# Patient Record
Sex: Female | Born: 1970
Health system: Southern US, Community
[De-identification: ages and names within clinical notes are randomized; demographics above are authoritative.]

## PROBLEM LIST (undated history)

## (undated) HISTORY — PX: ABDOMINAL HYSTERECTOMY: SHX81

---

## 2016-10-26 ENCOUNTER — Encounter (HOSPITAL_BASED_OUTPATIENT_CLINIC_OR_DEPARTMENT_OTHER): Payer: Self-pay | Admitting: Emergency Medicine

## 2016-10-26 ENCOUNTER — Emergency Department (HOSPITAL_BASED_OUTPATIENT_CLINIC_OR_DEPARTMENT_OTHER): Payer: BLUE CROSS/BLUE SHIELD

## 2016-10-26 ENCOUNTER — Emergency Department (HOSPITAL_BASED_OUTPATIENT_CLINIC_OR_DEPARTMENT_OTHER)
Admission: EM | Admit: 2016-10-26 | Discharge: 2016-10-26 | Disposition: A | Discharge status: Home / Self Care | Payer: BLUE CROSS/BLUE SHIELD | Attending: Emergency Medicine | Admitting: Emergency Medicine

## 2016-10-26 DIAGNOSIS — F17200 Nicotine dependence, unspecified, uncomplicated: Secondary | ICD-10-CM | POA: Insufficient documentation

## 2016-10-26 DIAGNOSIS — R1032 Left lower quadrant pain: Secondary | ICD-10-CM | POA: Insufficient documentation

## 2016-10-26 DIAGNOSIS — Z79899 Other long term (current) drug therapy: Secondary | ICD-10-CM | POA: Diagnosis not present

## 2016-10-26 DIAGNOSIS — R109 Unspecified abdominal pain: Secondary | ICD-10-CM

## 2016-10-26 LAB — URINALYSIS, ROUTINE W REFLEX MICROSCOPIC
BILIRUBIN URINE: NEGATIVE
Glucose, UA: NEGATIVE mg/dL
Hgb urine dipstick: NEGATIVE
Ketones, ur: NEGATIVE mg/dL
NITRITE: NEGATIVE
PH: 7 (ref 5.0–8.0)
Protein, ur: NEGATIVE mg/dL
SPECIFIC GRAVITY, URINE: 1.008 (ref 1.005–1.030)

## 2016-10-26 LAB — URINALYSIS, MICROSCOPIC (REFLEX): RBC / HPF: NONE SEEN RBC/hpf (ref 0–5)

## 2016-10-26 MED ORDER — ONDANSETRON 8 MG PO TBDP
8.0000 mg | ORAL_TABLET | Freq: Once | ORAL | Status: AC
  Administered 2016-10-26: 8 mg via ORAL
  Filled 2016-10-26: qty 1

## 2016-10-26 MED ORDER — TRAMADOL HCL 50 MG PO TABS: 50.0000 mg | ORAL_TABLET | Freq: Four times a day (QID) | ORAL | 0 refills | Status: AC | PRN

## 2016-10-26 MED ORDER — HYDROCODONE-ACETAMINOPHEN 5-325 MG PO TABS
2.0000 | ORAL_TABLET | Freq: Once | ORAL | Status: AC
  Administered 2016-10-26: 2 via ORAL
  Filled 2016-10-26: qty 2

## 2016-10-26 NOTE — ED Triage Notes (Signed)
L flank pain, dysuria, hematuria x 4 days. Sent from [facility].

## 2016-10-26 NOTE — ED Provider Notes (Addendum)
[facility]   CSN: [id number] Arrival date & time: 10/26/16  1056     History   Chief Complaint Chief Complaint  Patient presents with  . Flank Pain    HPI Danielle Douglas is a 46 y.o. female.  Patient c/o left flank pain, acute onset x 4 days. Pain constant, but waxes/wanes in severity, dull.  Occasionally radiates towards left leg.  No leg numbness/weakness. . No hx same pain. Denies injury or strain to area. No vomiting or diarrhea. No constipation. Denies hematuria. +blood in urine at outside urgent care. No vaginal bleeding or rectal bleeding. No hx kidney stones. No hx diverticula. Normal appetite. Denies cough or uri c/o. No cp or sob.    The history is provided by the patient.  Flank Pain  Associated symptoms include abdominal pain. Pertinent negatives include no chest pain, no headaches and no shortness of breath.    History reviewed. No pertinent past medical history.  There are no active problems to display for this patient.   Past Surgical History:  Procedure Laterality Date  . ABDOMINAL HYSTERECTOMY      OB History    No data available       Home Medications    Prior to Admission medications   Medication Sig Start Date End Date Taking? Authorizing Provider  pantoprazole (PROTONIX) 40 MG tablet Take 40 mg by mouth daily.   Yes [provider]    Family History No family history on file.  Social History Social History  Substance Use Topics  . Smoking status: Current Every Day Smoker  . Smokeless tobacco: Never Used  . Alcohol use Yes     Allergies   Percocet [oxycodone-acetaminophen]   Review of Systems Review of Systems  Constitutional: Negative for fever.  HENT: Negative for sore throat.   Eyes: Negative for redness.  Respiratory: Negative for shortness of breath.   Cardiovascular: Negative for chest pain.  Gastrointestinal: Positive for abdominal pain. Negative for vomiting.  Genitourinary:  Positive for flank pain. Negative for dysuria, hematuria, vaginal bleeding and vaginal discharge.  Musculoskeletal: Negative for back pain and neck pain.  Skin: Negative for rash.  Neurological: Negative for headaches.  Hematological: Does not bruise/bleed easily.  Psychiatric/Behavioral: Negative for confusion.     Physical Exam Updated Vital Signs BP 121/78 (BP Location: Right Arm)   Pulse (!) 58   Temp 98.9 F (37.2 C) (Oral)   Resp 18   Ht 1.676 m (5\' 6" )   Wt 81.6 kg (180 lb)   SpO2 98%   BMI 29.05 kg/m   Physical Exam  Constitutional: She appears well-developed and well-nourished. No distress.  HENT:  Mouth/Throat: Oropharynx is clear and moist.  Eyes: Conjunctivae are normal. No scleral icterus.  Neck: Neck supple. No tracheal deviation present.  Cardiovascular: Normal rate, regular rhythm, normal heart sounds and intact distal pulses.  Exam reveals no gallop and no friction rub.   No murmur heard. Pulmonary/Chest: Effort normal and breath sounds normal. No respiratory distress.  Abdominal: Soft. Normal appearance and bowel sounds are normal. She exhibits no distension and no mass. There is no tenderness. There is no rebound and no guarding. No hernia.  No incarcerated hernia.   Genitourinary:  Genitourinary Comments: No cva tenderness  Musculoskeletal: She exhibits no edema.  TLS spine non tender, aligned. Good rom left hip without pain.   Neurological: She is alert.  Ambulates w steady gait.   Skin: Skin is warm and dry. No rash  noted. She is not diaphoretic.  No shingles/rash to area of pain  Psychiatric: She has a normal mood and affect.  Nursing note and vitals reviewed.    ED Treatments / Results  Labs (all labs ordered are listed, but only abnormal results are displayed)  Results for orders placed or performed during the hospital encounter of 10/26/16  Urinalysis, Routine w reflex microscopic- may I&O cath if menses  Result Value Ref Range   Color,  Urine YELLOW YELLOW   APPearance CLEAR CLEAR   Specific Gravity, Urine 1.008 1.005 - 1.030   pH 7.0 5.0 - 8.0   Glucose, UA NEGATIVE NEGATIVE mg/dL   Hgb urine dipstick NEGATIVE NEGATIVE   Bilirubin Urine NEGATIVE NEGATIVE   Ketones, ur NEGATIVE NEGATIVE mg/dL   Protein, ur NEGATIVE NEGATIVE mg/dL   Nitrite NEGATIVE NEGATIVE   Leukocytes, UA TRACE (A) NEGATIVE  Urinalysis, Microscopic (reflex)  Result Value Ref Range   RBC / HPF NONE SEEN 0 - 5 RBC/hpf   WBC, UA 0-5 0 - 5 WBC/hpf   Bacteria, UA FEW (A) NONE SEEN   Squamous Epithelial / LPF 0-5 (A) NONE SEEN     EKG  EKG Interpretation None       Radiology Ct Renal Stone Study  Result Date: 10/26/2016 CLINICAL DATA:  Flank pain and hematuria. EXAM: CT ABDOMEN AND PELVIS WITHOUT CONTRAST TECHNIQUE: Multidetector CT imaging of the abdomen and pelvis was performed following the standard protocol without IV contrast. COMPARISON:  None FINDINGS: Lower chest: The lung bases are clear. No pleural or pericardial effusion. Hepatobiliary: No focal liver abnormality is seen. No gallstones, gallbladder wall thickening, or biliary dilatation. Pancreas: Unremarkable. No pancreatic ductal dilatation or surrounding inflammatory changes. Spleen: Normal in size without focal abnormality. Adrenals/Urinary Tract: The adrenal glands are normal. No kidney mass or hydronephrosis identified. No kidney stones. The urinary bladder is unremarkable. Stomach/Bowel: The stomach and the small bowel loops have a normal course and caliber. No bowel obstruction. The appendix is not visualized and may be surgically absent. Unremarkable appearance of the colon. Vascular/Lymphatic: No significant vascular findings are present. No enlarged abdominal or pelvic lymph nodes. Reproductive: Status post hysterectomy.  No adnexal mass. Other: Trace free fluid noted within the dependent portion of the pelvis. Musculoskeletal: No acute or significant osseous findings. IMPRESSION: 1.  No acute findings within the abdomen or pelvis. 2. Status post hysterectomy 3. Trace free fluid noted within the pelvis. Electronically Signed   By: Signa Kellaylor  Stroud M.D.   On: 10/26/2016 12:15    Procedures Procedures (including critical care time)  Medications Ordered in ED Medications - No data to display   Initial Impression / Assessment and Plan / ED Course  I have reviewed the triage vital signs and the nursing notes.  Pertinent labs & imaging results that were available during my care of the patient were reviewed by me and considered in my medical decision making (see chart for details).   Labs. Imaging.   Hydrocodone po. Zofran po. (pt has ride, does not have to drive)  Reviewed nursing notes and prior charts for additional history.   Pain improved on recheck. abd soft nt.   Ct unremarkable. Discussed diff including tiny/passed stone, vs musculoskeletal, vs nerve impingement, vs other.  As improved, afeb, neg ct, pt appears stable for d/c.     Final Clinical Impressions(s) / ED Diagnoses   Final diagnoses:  None    New Prescriptions New Prescriptions   No medications on file  Cathren Laine, MD 10/26/16 [phone]

## 2016-10-26 NOTE — Discharge Instructions (Signed)
It was our pleasure to provide your ER care today - we hope that you feel better.  Take motrin or aleve as need for pain.  You may also take ultram as need for pain - no driving for the next 4 hours, or if/when taking zofran.  Follow up with your doctor in the coming week if symptoms fail to improve/resolve.  Return to ER if worse, new symptoms, fevers, new or severe pain, other concern.

## 2016-10-26 NOTE — ED Notes (Signed)
Pt requesting pain medication, MD informed.

## 2017-05-07 ENCOUNTER — Emergency Department (HOSPITAL_BASED_OUTPATIENT_CLINIC_OR_DEPARTMENT_OTHER)
Admission: EM | Admit: 2017-05-07 | Discharge: 2017-05-07 | Disposition: A | Discharge status: Home / Self Care | Payer: BLUE CROSS/BLUE SHIELD | Attending: Emergency Medicine | Admitting: Emergency Medicine

## 2017-05-07 ENCOUNTER — Encounter (HOSPITAL_BASED_OUTPATIENT_CLINIC_OR_DEPARTMENT_OTHER): Payer: Self-pay | Admitting: Emergency Medicine

## 2017-05-07 ENCOUNTER — Other Ambulatory Visit: Payer: Self-pay

## 2017-05-07 ENCOUNTER — Emergency Department (HOSPITAL_BASED_OUTPATIENT_CLINIC_OR_DEPARTMENT_OTHER): Payer: BLUE CROSS/BLUE SHIELD

## 2017-05-07 DIAGNOSIS — S99922A Unspecified injury of left foot, initial encounter: Secondary | ICD-10-CM | POA: Insufficient documentation

## 2017-05-07 DIAGNOSIS — Y929 Unspecified place or not applicable: Secondary | ICD-10-CM | POA: Diagnosis not present

## 2017-05-07 DIAGNOSIS — Y939 Activity, unspecified: Secondary | ICD-10-CM | POA: Diagnosis not present

## 2017-05-07 DIAGNOSIS — Y999 Unspecified external cause status: Secondary | ICD-10-CM | POA: Diagnosis not present

## 2017-05-07 DIAGNOSIS — W2209XA Striking against other stationary object, initial encounter: Secondary | ICD-10-CM | POA: Insufficient documentation

## 2017-05-07 DIAGNOSIS — F17210 Nicotine dependence, cigarettes, uncomplicated: Secondary | ICD-10-CM | POA: Insufficient documentation

## 2017-05-07 MED ORDER — IBUPROFEN 800 MG PO TABS: 800.0000 mg | ORAL_TABLET | Freq: Three times a day (TID) | ORAL | 0 refills | Status: AC | PRN

## 2017-05-07 MED FILL — IBUPROFEN 800 MG TAB: 800 | 7 days supply | Qty: 21 | Fill #0

## 2017-05-07 NOTE — Discharge Instructions (Signed)
He was seen in the emergency department today with a foot injury.  There was no fracture on x-ray.  Use the crutches as needed.  Take Motrin as needed for pain.  When at home, elevate the affected extremity and apply ice for the next 24 hours.  Follow with your primary care physician and/or podiatry as needed if symptoms worsen.

## 2017-05-07 NOTE — ED Provider Notes (Signed)
Emergency Department Provider Note   I have reviewed the triage vital signs and the nursing notes.   HISTORY  Chief Complaint Toe Injury   HPI Danielle Douglas is a 47 y.o. female presents to the emergency department for evaluation of left toe pain after hitting the toes this morning.  Patient was walking around to making the bed when she pushed into a door and hit her foot against the molding.  Since that time she has had pain and mild swelling in the left second and third toes.  She reports severe pain in this area worse with movement but also some numbness and tingling.  No pain in the ankle or knee.  No fall or head trauma.  No alleviating factors.   History reviewed. No pertinent past medical history.  There are no active problems to display for this patient.   Past Surgical History:  Procedure Laterality Date  . ABDOMINAL HYSTERECTOMY      Current Outpatient Rx  . Order #: [id number] Class: Print  . Order #: [id number] Class: Historical Med  . Order #: [id number] Class: Print    Allergies Percocet [oxycodone-acetaminophen]  History reviewed. No pertinent family history.  Social History Social History   Tobacco Use  . Smoking status: Current Every Day Smoker  . Smokeless tobacco: Never Used  Substance Use Topics  . Alcohol use: Yes  . Drug use: No    Review of Systems  Constitutional: No fever/chills Eyes: No visual changes. ENT: No sore throat. Cardiovascular: Denies chest pain. Respiratory: Denies shortness of breath. Gastrointestinal: No abdominal pain.  No nausea, no vomiting.  No diarrhea.  No constipation. Genitourinary: Negative for dysuria. Musculoskeletal: Negative for back pain. Positive left toe pain.  Skin: Negative for rash. Neurological: Negative for headaches, focal weakness or numbness.  10-point ROS otherwise negative.  ____________________________________________   PHYSICAL EXAM:  VITAL SIGNS: ED Triage Vitals  Enc Vitals Group      BP 05/07/17 1045 121/61     Pulse Rate 05/07/17 1045 (!) 56     Resp 05/07/17 1045 18     Temp 05/07/17 1045 98.6 F (37 C)     Temp Source 05/07/17 1045 Oral     SpO2 05/07/17 1045 95 %     Weight 05/07/17 1044 162 lb (73.5 kg)     Height 05/07/17 1044 5\' 7"  (1.702 m)     Pain Score 05/07/17 1044 5   Constitutional: Alert and oriented. Well appearing and in no acute distress. Eyes: Conjunctivae are normal. Head: Atraumatic. Nose: No congestion/rhinnorhea. Mouth/Throat: Mucous membranes are moist.  Neck: No stridor.  Cardiovascular: Good peripheral circulation. Respiratory: Normal respiratory effort.  Gastrointestinal: No distention.  Musculoskeletal: No lower extremity tenderness nor edema. No gross deformities of extremities. Mild swelling and bruising of the left 2nd and 3rd toes. No gross deformity. Normal sensation. Good capillary refill.  Neurologic:  Normal speech and language. No gross focal neurologic deficits are appreciated.  Skin:  Skin is warm, dry and intact. No rash noted.  ____________________________________________  RADIOLOGY  Dg Foot 2 Views Left  Result Date: 05/07/2017 CLINICAL DATA:  Left toe injury.  Pain, swelling EXAM: LEFT FOOT - 2 VIEW COMPARISON:  None. FINDINGS: There is no evidence of fracture or dislocation. There is no evidence of arthropathy or other focal bone abnormality. Soft tissues are unremarkable. IMPRESSION: Negative. Electronically Signed   By: Charlett Nose M.D.   On: 05/07/2017 11:11    ____________________________________________   PROCEDURES  Procedure(s) performed:  Procedures  None ____________________________________________   INITIAL IMPRESSION / ASSESSMENT AND PLAN / ED COURSE  Pertinent labs & imaging results that were available during my care of the patient were reviewed by me and considered in my medical decision making (see chart for details).  Patient presents to the emergency department for evaluation of  left toe pain after striking it against a fixed object.  She has mild bruising and swelling with no deformity.  X-ray of the foot is negative for acute fracture.  Plan for crutches and buddy tape.  Will provide contact information for outpatient podiatry for follow-up.  At this time, I do not feel there is any life-threatening condition present. I have reviewed and discussed all results (EKG, imaging, lab, urine as appropriate), exam findings with patient. I have reviewed nursing notes and appropriate previous records.  I feel the patient is safe to be discharged home without further emergent workup. Discussed usual and customary return precautions. Patient and family (if present) verbalize understanding and are comfortable with this plan.  Patient will follow-up with their primary care provider. If they do not have a primary care provider, information for follow-up has been provided to them. All questions have been answered.  ____________________________________________  FINAL CLINICAL IMPRESSION(S) / ED DIAGNOSES  Final diagnoses:  Injury of toe on left foot, initial encounter    NEW OUTPATIENT MEDICATIONS STARTED DURING THIS VISIT:  New Prescriptions   IBUPROFEN (ADVIL,MOTRIN) 800 MG TABLET    Take 1 tablet (800 mg total) by mouth every 8 (eight) hours as needed.    Note:  This document was prepared using Dragon voice recognition software and may include unintentional dictation errors.  Alona BeneJoshua Kimiye Strathman, MD Emergency Medicine    Cadience Bradfield, Arlyss RepressJoshua G, MD 05/07/17 [phone]

## 2017-05-07 NOTE — ED Triage Notes (Signed)
Patient states that she is having pain to her left foot, the 2 middle toes. She jammed them into a wood block this am

## 2018-06-09 IMAGING — CR DG FOOT 2V*L*
2 series · 2 of 2 positions shown · non-contrast
Comparison: None.

CLINICAL DATA: Left toe injury.  Pain, swelling

EXAM:
LEFT FOOT - 2 VIEW

[t foot ap left]
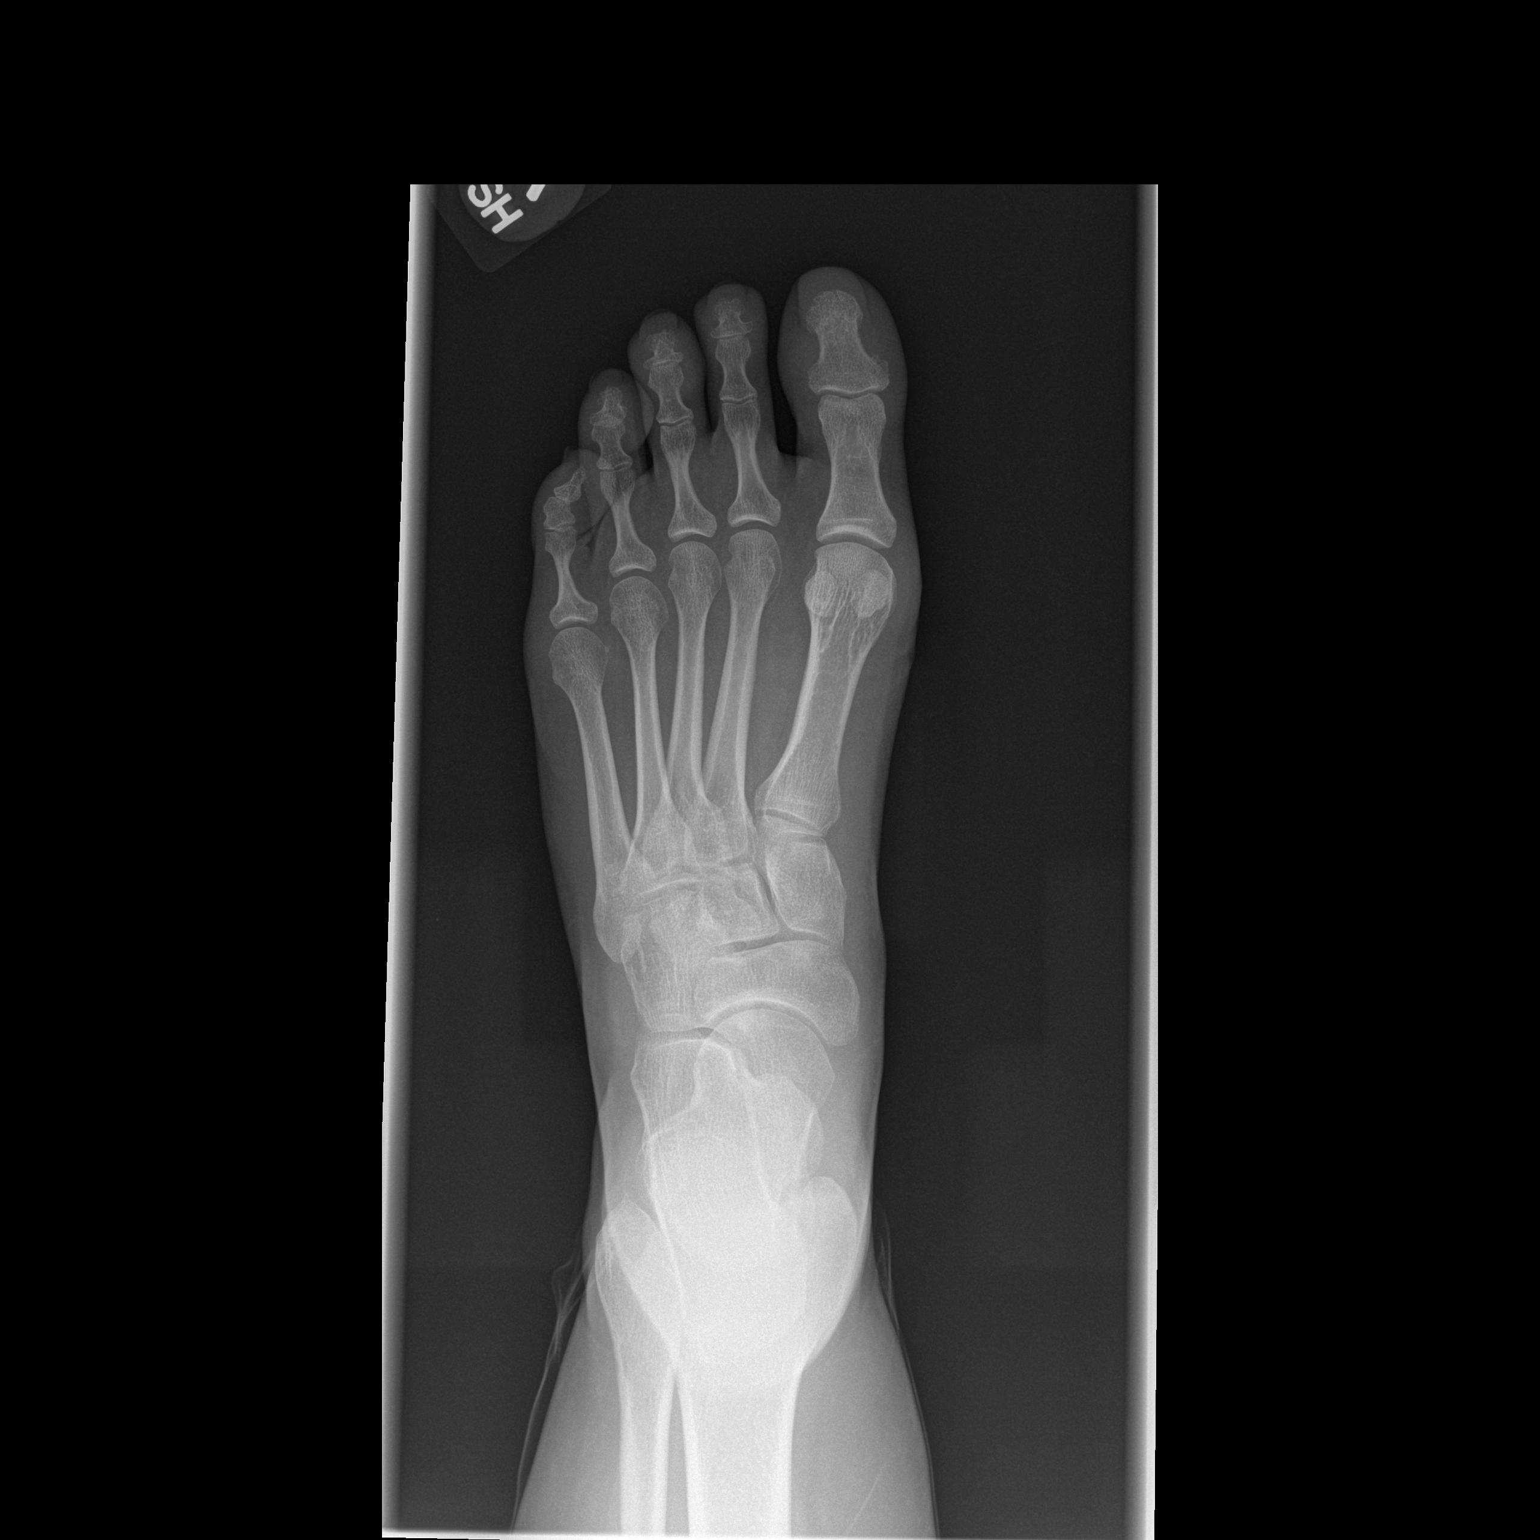

[t foot lat left]
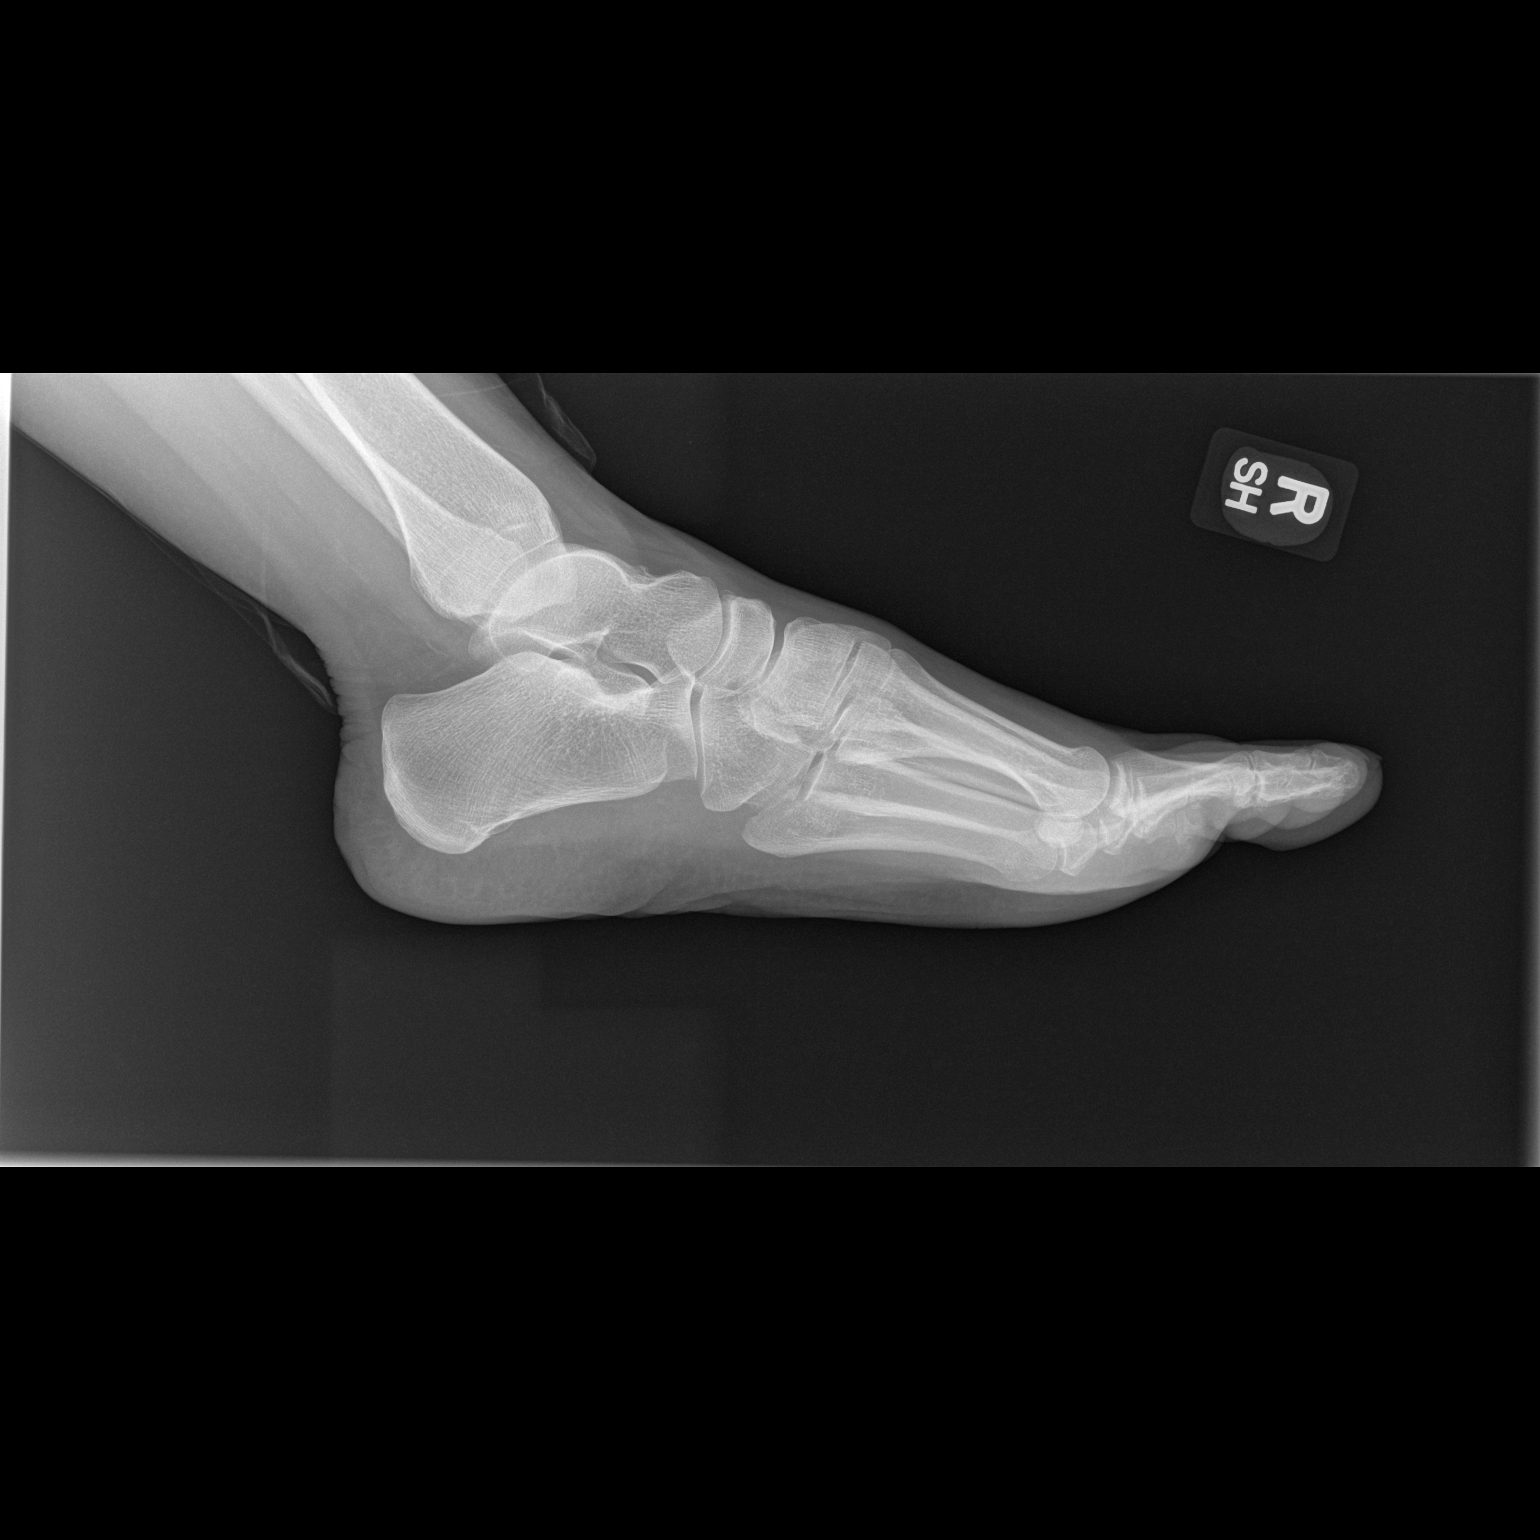

[2 of 2 positions shown; findings below may reference images not displayed]

FINDINGS: There is no evidence of fracture or dislocation. There is no
evidence of arthropathy or other focal bone abnormality. Soft
tissues are unremarkable.
IMPRESSION: Negative.
